# Patient Record
Sex: Male | Born: 1960 | Race: White | Hispanic: No | Marital: Married | Smoking: Current every day smoker
Health system: Southern US, Community
[De-identification: ages and names within clinical notes are randomized; demographics above are authoritative.]

## PROBLEM LIST (undated history)

## (undated) DIAGNOSIS — G8929 Other chronic pain: Secondary | ICD-10-CM

## (undated) DIAGNOSIS — E785 Hyperlipidemia, unspecified: Secondary | ICD-10-CM

## (undated) DIAGNOSIS — M1990 Unspecified osteoarthritis, unspecified site: Secondary | ICD-10-CM

## (undated) HISTORY — DX: Hyperlipidemia, unspecified: E78.5

## (undated) HISTORY — DX: Other chronic pain: G89.29

## (undated) HISTORY — PX: CERVICAL SPINE SURGERY: SHX589

---

## 2005-07-17 ENCOUNTER — Ambulatory Visit: Payer: Self-pay | Admitting: Orthopedic Surgery

## 2005-07-28 ENCOUNTER — Ambulatory Visit: Payer: Self-pay | Admitting: Orthopedic Surgery

## 2005-09-08 ENCOUNTER — Ambulatory Visit: Payer: Self-pay | Admitting: Orthopedic Surgery

## 2005-09-26 ENCOUNTER — Ambulatory Visit: Payer: Self-pay | Admitting: Orthopedic Surgery

## 2005-09-26 ENCOUNTER — Ambulatory Visit (HOSPITAL_COMMUNITY): Admission: RE | Admit: 2005-09-26 | Discharge: 2005-09-26 | Payer: Self-pay | Admitting: Orthopedic Surgery

## 2005-09-29 ENCOUNTER — Ambulatory Visit: Payer: Self-pay | Admitting: Orthopedic Surgery

## 2005-10-13 ENCOUNTER — Ambulatory Visit: Payer: Self-pay | Admitting: Orthopedic Surgery

## 2005-11-20 ENCOUNTER — Ambulatory Visit: Payer: Self-pay | Admitting: Orthopedic Surgery

## 2017-10-30 ENCOUNTER — Encounter: Payer: Self-pay | Admitting: Cardiovascular Disease

## 2017-10-30 ENCOUNTER — Ambulatory Visit: Payer: Managed Care, Other (non HMO) | Admitting: Cardiovascular Disease

## 2017-10-30 VITALS — BP 108/64 | HR 62 | Ht 64.0 in | Wt 150.0 lb

## 2017-10-30 DIAGNOSIS — R0789 Other chest pain: Secondary | ICD-10-CM

## 2017-10-30 NOTE — Patient Instructions (Signed)
Medication Instructions: Your physician recommends that you continue on your current medications as directed. Please refer to the Current Medication list given to you today.   Testing/Procedures: Your physician has requested that you have an exercise stress myoview. For further information please visit [email or web address]. Please follow instruction sheet, as given.  Follow-Up: Your physician recommends that you schedule a follow-up appointment as needed with Dr. Berry.   

## 2017-10-30 NOTE — Progress Notes (Signed)
10/30/2017 Billy KraussJohn A Richards   14-May-1961  [id number]  Primary Physician Patient, No Pcp Per Primary Cardiologist: Runell GessJonathan J Tonye Tancredi MD Roseanne RenoFACP, FACC, FAHA, FSCAI  HPI:  Billy Richards is a 57 y.o. thin-appearing married Caucasian male father of 2 daughters, grandfather and 4 grandchildren who works as a Pensions consultanttechnician at [employer]. He was [facility] room for cardiovascular evaluation because of a recent admission for chest pain/rule out myocardial infarction. His only risk factor is tobacco abuse having smoked 20-pack-years currently smoking one half pack per day. There is no family history. Never had a heart attack or stroke. He was awakened with [place] morning with chest pain on 10/24/17 and went to the emergency room previous EKG showed nonspecific changes and his enzymes were negative. A chest CT was negative for pulmonary embolus. He has had several less intense episodes of chest pain since discharge.    Current Meds  Medication Sig  . meloxicam (MOBIC) 15 MG tablet Take 15 mg by mouth daily. with food  . traMADol (ULTRAM) 50 MG tablet Take 1 tablet by mouth daily.     Not on File  Social History   Socioeconomic History  . Marital status: Married    Spouse name: Not on file  . Number of children: Not on file  . Years of education: Not on file  . Highest education level: Not on file  Occupational History  . Not on file  Social Needs  . Financial resource strain: Not on file  . Food insecurity:    Worry: Not on file    Inability: Not on file  . Transportation needs:    Medical: Not on file    Non-medical: Not on file  Tobacco Use  . Smoking status: Current Every Day Smoker  . Smokeless tobacco: Never Used  Substance and Sexual Activity  . Alcohol use: Not on file  . Drug use: Not on file  . Sexual activity: Not on file  Lifestyle  . Physical activity:    Days per week: Not on file    Minutes per session: Not on file  . Stress: Not on  file  Relationships  . Social connections:    Talks on phone: Not on file    Gets together: Not on file    Attends religious service: Not on file    Active member of club or organization: Not on file    Attends meetings of clubs or organizations: Not on file    Relationship status: Not on file  . Intimate partner violence:    Fear of current or ex partner: Not on file    Emotionally abused: Not on file    Physically abused: Not on file    Forced sexual activity: Not on file  Other Topics Concern  . Not on file  Social History Narrative  . Not on file     Review of Systems: General: negative for chills, fever, night sweats or weight changes.  Cardiovascular: negative for chest pain, dyspnea on exertion, edema, orthopnea, palpitations, paroxysmal nocturnal dyspnea or shortness of breath Dermatological: negative for rash Respiratory: negative for cough or wheezing Urologic: negative for hematuria Abdominal: negative for nausea, vomiting, diarrhea, bright red blood per rectum, melena, or hematemesis Neurologic: negative for visual changes, syncope, or dizziness All other systems reviewed and are otherwise negative except as noted above.    Blood pressure 108/64, pulse 62, height 5\' 4"  (1.626 m), weight 150 lb (68 kg).  General appearance: alert and no distress Neck: no adenopathy, no carotid bruit, no JVD, supple, symmetrical, trachea midline and thyroid not enlarged, symmetric, no tenderness/mass/nodules Lungs: clear to auscultation bilaterally Heart: regular rate and rhythm, S1, S2 normal, no murmur, click, rub or gallop Extremities: extremities normal, atraumatic, no cyanosis or edema Pulses: 2+ and symmetric Skin: Skin color, texture, turgor normal. No rashes or lesions Neurologic: Alert and oriented X 3, normal strength and tone. Normal symmetric reflexes. Normal coordination and gait  EKG sinus rhythm at 62 without ST or T-wave changes. I personally reviewed this  EKG.  ASSESSMENT AND PLAN:   Atypical chest pain Mr. Dobrowski is referred here for atypical chest pain after a recent chest pain rule out MI admission at [facility] on 10/24/17. His only cardiac risk factor is tobacco abuse having smoked 20-pack-years and continuing to smoke a half a pack a day. He's never had a heart attack or stroke. There is no family history. He was awakened at 3 in the morning with substernal chest pressure. He was seen in the emergency room and admitted. For several EKGs showed minimal abnormalities and enzymes were negative. A CT scan showed no evidence of pulmonary embolus. He's had several recurrent less severe episodes since discharge. I believe it would be prudent to proceed with exercise stress testing to rule out CAD.      Runell Gess MD FACP,FACC,FAHA, [facility] 10/30/2017 12:15 PM

## 2017-10-30 NOTE — Assessment & Plan Note (Signed)
Mr. Billy Richards is referred here for atypical chest pain after a recent chest pain rule out MI admission at [facility] on 10/24/17. His only cardiac risk factor is tobacco abuse having smoked 20-pack-years and continuing to smoke a half a pack a day. He's never had a heart attack or stroke. There is no family history. He was awakened at 3 in the morning with substernal chest pressure. He was seen in the emergency room and admitted. For several EKGs showed minimal abnormalities and enzymes were negative. A CT scan showed no evidence of pulmonary embolus. He's had several recurrent less severe episodes since discharge. I believe it would be prudent to proceed with exercise stress testing to rule out CAD.

## 2017-11-03 ENCOUNTER — Telehealth (HOSPITAL_COMMUNITY): Payer: Self-pay

## 2017-11-03 NOTE — Addendum Note (Signed)
Addended by: Kandice RobinsonsYOUNG, Tagen Milby T on: 11/03/2017 10:29 AM   Modules accepted: Orders

## 2017-11-03 NOTE — Telephone Encounter (Signed)
Encounter complete. 

## 2017-11-05 ENCOUNTER — Ambulatory Visit (HOSPITAL_COMMUNITY)
Admission: RE | Admit: 2017-11-05 | Discharge: 2017-11-05 | Disposition: A | Discharge status: Home / Self Care | Payer: Managed Care, Other (non HMO) | Source: Ambulatory Visit | Attending: Cardiology | Admitting: Cardiology

## 2017-11-05 DIAGNOSIS — R0789 Other chest pain: Secondary | ICD-10-CM

## 2017-11-05 LAB — MYOCARDIAL PERFUSION IMAGING
CHL CUP MPHR: 165 {beats}/min
CHL CUP NUCLEAR SDS: 1
CHL CUP NUCLEAR SSS: 1
CHL CUP RESTING HR STRESS: 58 {beats}/min
CHL RATE OF PERCEIVED EXERTION: 18
CSEPEW: 12 METS
CSEPHR: 96 %
Exercise duration (min): 10 min
Exercise duration (sec): 10 s
LV dias vol: 100 mL (ref 62–150)
LV sys vol: 44 mL
NUC STRESS TID: 0.92
Peak HR: 160 {beats}/min
SRS: 0

## 2017-11-05 MED ORDER — TECHNETIUM TC 99M TETROFOSMIN IV KIT
9.9000 | PACK | Freq: Once | INTRAVENOUS | Status: AC | PRN
  Administered 2017-11-05: 9.9 via INTRAVENOUS
  Filled 2017-11-05: qty 10

## 2017-11-05 MED ORDER — TECHNETIUM TC 99M TETROFOSMIN IV KIT
30.2000 | PACK | Freq: Once | INTRAVENOUS | Status: AC | PRN
  Administered 2017-11-05: 30.2 via INTRAVENOUS
  Filled 2017-11-05: qty 31

## 2019-08-30 IMAGING — NM NM MISC PROCEDURE
9 series · 54 of 54 positions shown · non-contrast
Comparison: none

[Series 1: wbr rest · 6.40mm/px · 6 of 64 frames shown]
[frame 6/64]
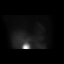
[frame 16/64]
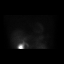
[frame 27/64]
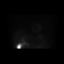
[frame 38/64]
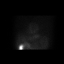
[frame 48/64]
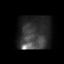
[frame 59/64]
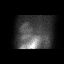

[Series 1: wbr_r-proj_st wbr rest · 6.40mm/px · 6 of 64 frames shown]
[frame 6/64]
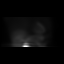
[frame 16/64]
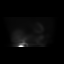
[frame 27/64]
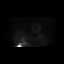
[frame 38/64]
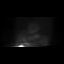
[frame 48/64]
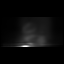
[frame 59/64]
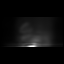

[Series 1: rest sax · 6.4mm · 6.40mm/px · 6 of 20 frames shown]
[frame 2/20]
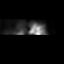
[frame 5/20]
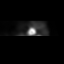
[frame 9/20]
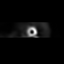
[frame 12/20]
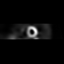
[frame 15/20]
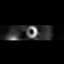
[frame 19/20]
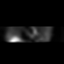

[Series 2: wbr stress-gsp · 6.40mm/px · 6 of 511 frames shown]
[frame 43/511  full-range]
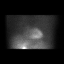
[frame 128/511  full-range]
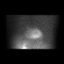
[frame 213/511  full-range]
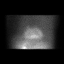
[frame 298/511  full-range]
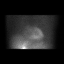
[frame 383/511  full-range]
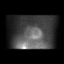
[frame 469/511  full-range]
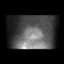

[Series 2: stress sax gs · 6.4mm · 6.40mm/px · 6 of 160 frames shown]
[frame 14/160]
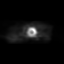
[frame 40/160]
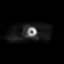
[frame 67/160]
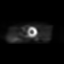
[frame 94/160]
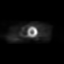
[frame 120/160]
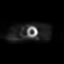
[frame 147/160]
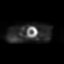

[Series 2: wbr_s-proj_st wbr stress-gsp · 6.40mm/px · 6 of 512 frames shown]
[frame 43/512]
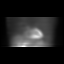
[frame 128/512]
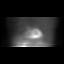
[frame 214/512]
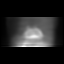
[frame 299/512]
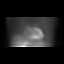
[frame 384/512]
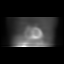
[frame 470/512]
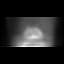

[Series 3: wbr stress-sum-em · 6.40mm/px · 6 of 64 frames shown]
[frame 6/64]
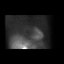
[frame 16/64]
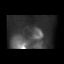
[frame 27/64]
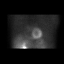
[frame 38/64]
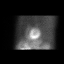
[frame 48/64]
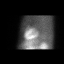
[frame 59/64]
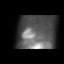

[Series 3: wbr_s-proj_st wbr stress-sum-em · 6.40mm/px · 6 of 64 frames shown]
[frame 6/64]
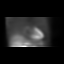
[frame 16/64]
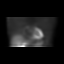
[frame 27/64]
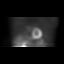
[frame 38/64]
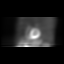
[frame 48/64]
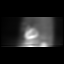
[frame 59/64]
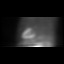

[Series 3: stress sax · 6.4mm · 6.40mm/px · 6 of 20 frames shown]
[frame 2/20]
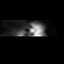
[frame 5/20]
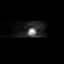
[frame 9/20]
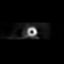
[frame 12/20]
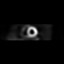
[frame 15/20]
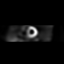
[frame 19/20]
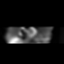

[54 of 54 positions shown; findings below may reference images not displayed]

Canned report from images found in remote index.

Refer to host system for actual result text.

## 2021-02-27 DIAGNOSIS — E782 Mixed hyperlipidemia: Secondary | ICD-10-CM | POA: Diagnosis not present

## 2021-02-27 DIAGNOSIS — R7301 Impaired fasting glucose: Secondary | ICD-10-CM | POA: Diagnosis not present

## 2021-02-27 DIAGNOSIS — R972 Elevated prostate specific antigen [PSA]: Secondary | ICD-10-CM | POA: Diagnosis not present

## 2021-03-04 DIAGNOSIS — R7303 Prediabetes: Secondary | ICD-10-CM | POA: Diagnosis not present

## 2021-03-04 DIAGNOSIS — F17200 Nicotine dependence, unspecified, uncomplicated: Secondary | ICD-10-CM | POA: Diagnosis not present

## 2021-03-04 DIAGNOSIS — M542 Cervicalgia: Secondary | ICD-10-CM | POA: Diagnosis not present

## 2021-03-04 DIAGNOSIS — G629 Polyneuropathy, unspecified: Secondary | ICD-10-CM | POA: Diagnosis not present

## 2021-03-04 DIAGNOSIS — Z113 Encounter for screening for infections with a predominantly sexual mode of transmission: Secondary | ICD-10-CM | POA: Diagnosis not present

## 2021-03-04 DIAGNOSIS — Z0001 Encounter for general adult medical examination with abnormal findings: Secondary | ICD-10-CM | POA: Diagnosis not present

## 2021-03-04 DIAGNOSIS — E782 Mixed hyperlipidemia: Secondary | ICD-10-CM | POA: Diagnosis not present

## 2021-03-04 DIAGNOSIS — G8929 Other chronic pain: Secondary | ICD-10-CM | POA: Diagnosis not present

## 2021-03-04 DIAGNOSIS — M5030 Other cervical disc degeneration, unspecified cervical region: Secondary | ICD-10-CM | POA: Diagnosis not present

## 2021-05-27 DIAGNOSIS — R0781 Pleurodynia: Secondary | ICD-10-CM | POA: Diagnosis not present

## 2021-05-27 DIAGNOSIS — G8929 Other chronic pain: Secondary | ICD-10-CM | POA: Diagnosis not present

## 2021-08-28 DIAGNOSIS — G8929 Other chronic pain: Secondary | ICD-10-CM | POA: Diagnosis not present

## 2021-08-28 DIAGNOSIS — M545 Low back pain, unspecified: Secondary | ICD-10-CM | POA: Diagnosis not present

## 2021-11-26 DIAGNOSIS — G8929 Other chronic pain: Secondary | ICD-10-CM | POA: Diagnosis not present

## 2021-11-26 DIAGNOSIS — M545 Low back pain, unspecified: Secondary | ICD-10-CM | POA: Diagnosis not present

## 2022-02-27 DIAGNOSIS — R7303 Prediabetes: Secondary | ICD-10-CM | POA: Diagnosis not present

## 2022-02-27 DIAGNOSIS — E782 Mixed hyperlipidemia: Secondary | ICD-10-CM | POA: Diagnosis not present

## 2022-03-06 DIAGNOSIS — G8929 Other chronic pain: Secondary | ICD-10-CM | POA: Diagnosis not present

## 2022-03-06 DIAGNOSIS — M5030 Other cervical disc degeneration, unspecified cervical region: Secondary | ICD-10-CM | POA: Diagnosis not present

## 2022-03-06 DIAGNOSIS — R7303 Prediabetes: Secondary | ICD-10-CM | POA: Diagnosis not present

## 2022-03-06 DIAGNOSIS — B009 Herpesviral infection, unspecified: Secondary | ICD-10-CM | POA: Diagnosis not present

## 2022-03-06 DIAGNOSIS — Z0001 Encounter for general adult medical examination with abnormal findings: Secondary | ICD-10-CM | POA: Diagnosis not present

## 2022-03-06 DIAGNOSIS — G629 Polyneuropathy, unspecified: Secondary | ICD-10-CM | POA: Diagnosis not present

## 2022-03-06 DIAGNOSIS — M542 Cervicalgia: Secondary | ICD-10-CM | POA: Diagnosis not present

## 2022-03-06 DIAGNOSIS — F17200 Nicotine dependence, unspecified, uncomplicated: Secondary | ICD-10-CM | POA: Diagnosis not present

## 2022-03-06 DIAGNOSIS — E782 Mixed hyperlipidemia: Secondary | ICD-10-CM | POA: Diagnosis not present

## 2022-05-28 DIAGNOSIS — H5203 Hypermetropia, bilateral: Secondary | ICD-10-CM | POA: Diagnosis not present

## 2022-05-28 DIAGNOSIS — H40013 Open angle with borderline findings, low risk, bilateral: Secondary | ICD-10-CM | POA: Diagnosis not present

## 2022-05-28 DIAGNOSIS — H25813 Combined forms of age-related cataract, bilateral: Secondary | ICD-10-CM | POA: Diagnosis not present

## 2022-08-28 DIAGNOSIS — M5416 Radiculopathy, lumbar region: Secondary | ICD-10-CM | POA: Diagnosis not present

## 2022-09-09 DIAGNOSIS — M48061 Spinal stenosis, lumbar region without neurogenic claudication: Secondary | ICD-10-CM | POA: Diagnosis not present

## 2022-09-09 DIAGNOSIS — M5126 Other intervertebral disc displacement, lumbar region: Secondary | ICD-10-CM | POA: Diagnosis not present

## 2022-09-09 DIAGNOSIS — M5416 Radiculopathy, lumbar region: Secondary | ICD-10-CM | POA: Diagnosis not present

## 2022-09-09 DIAGNOSIS — M47816 Spondylosis without myelopathy or radiculopathy, lumbar region: Secondary | ICD-10-CM | POA: Diagnosis not present

## 2022-09-12 DIAGNOSIS — R7303 Prediabetes: Secondary | ICD-10-CM | POA: Diagnosis not present

## 2022-09-12 DIAGNOSIS — Z139 Encounter for screening, unspecified: Secondary | ICD-10-CM | POA: Diagnosis not present

## 2022-09-15 DIAGNOSIS — G629 Polyneuropathy, unspecified: Secondary | ICD-10-CM | POA: Diagnosis not present

## 2022-09-15 DIAGNOSIS — Z0000 Encounter for general adult medical examination without abnormal findings: Secondary | ICD-10-CM | POA: Diagnosis not present

## 2022-09-15 DIAGNOSIS — M5431 Sciatica, right side: Secondary | ICD-10-CM | POA: Diagnosis not present

## 2022-09-15 DIAGNOSIS — B009 Herpesviral infection, unspecified: Secondary | ICD-10-CM | POA: Diagnosis not present

## 2022-09-15 DIAGNOSIS — R7303 Prediabetes: Secondary | ICD-10-CM | POA: Diagnosis not present

## 2022-09-15 DIAGNOSIS — M5030 Other cervical disc degeneration, unspecified cervical region: Secondary | ICD-10-CM | POA: Diagnosis not present

## 2022-09-15 DIAGNOSIS — M542 Cervicalgia: Secondary | ICD-10-CM | POA: Diagnosis not present

## 2022-09-15 DIAGNOSIS — E782 Mixed hyperlipidemia: Secondary | ICD-10-CM | POA: Diagnosis not present

## 2022-09-15 DIAGNOSIS — F17210 Nicotine dependence, cigarettes, uncomplicated: Secondary | ICD-10-CM | POA: Diagnosis not present

## 2023-03-19 DIAGNOSIS — R7303 Prediabetes: Secondary | ICD-10-CM | POA: Diagnosis not present

## 2023-03-19 DIAGNOSIS — M5416 Radiculopathy, lumbar region: Secondary | ICD-10-CM | POA: Diagnosis not present

## 2023-04-06 DIAGNOSIS — M5416 Radiculopathy, lumbar region: Secondary | ICD-10-CM | POA: Diagnosis not present

## 2023-04-22 ENCOUNTER — Other Ambulatory Visit (HOSPITAL_COMMUNITY): Payer: Self-pay | Admitting: Family Medicine

## 2023-04-22 DIAGNOSIS — E782 Mixed hyperlipidemia: Secondary | ICD-10-CM

## 2023-04-22 DIAGNOSIS — Z0000 Encounter for general adult medical examination without abnormal findings: Secondary | ICD-10-CM | POA: Diagnosis not present

## 2023-04-22 DIAGNOSIS — E875 Hyperkalemia: Secondary | ICD-10-CM | POA: Diagnosis not present

## 2023-04-22 DIAGNOSIS — M5431 Sciatica, right side: Secondary | ICD-10-CM | POA: Diagnosis not present

## 2023-04-22 DIAGNOSIS — R7303 Prediabetes: Secondary | ICD-10-CM | POA: Diagnosis not present

## 2023-04-22 DIAGNOSIS — R5383 Other fatigue: Secondary | ICD-10-CM | POA: Diagnosis not present

## 2023-04-22 DIAGNOSIS — J0100 Acute maxillary sinusitis, unspecified: Secondary | ICD-10-CM | POA: Diagnosis not present

## 2023-04-22 DIAGNOSIS — Z0001 Encounter for general adult medical examination with abnormal findings: Secondary | ICD-10-CM | POA: Diagnosis not present

## 2023-04-22 DIAGNOSIS — M5030 Other cervical disc degeneration, unspecified cervical region: Secondary | ICD-10-CM | POA: Diagnosis not present

## 2023-04-30 DIAGNOSIS — M533 Sacrococcygeal disorders, not elsewhere classified: Secondary | ICD-10-CM | POA: Diagnosis not present

## 2023-05-12 DIAGNOSIS — M461 Sacroiliitis, not elsewhere classified: Secondary | ICD-10-CM | POA: Diagnosis not present

## 2023-05-29 ENCOUNTER — Ambulatory Visit (HOSPITAL_COMMUNITY): Admission: RE | Admit: 2023-05-29 | Payer: Managed Care, Other (non HMO) | Source: Ambulatory Visit

## 2023-05-29 ENCOUNTER — Encounter (HOSPITAL_COMMUNITY): Payer: Self-pay

## 2023-06-02 DIAGNOSIS — M461 Sacroiliitis, not elsewhere classified: Secondary | ICD-10-CM | POA: Diagnosis not present

## 2023-08-17 DIAGNOSIS — M461 Sacroiliitis, not elsewhere classified: Secondary | ICD-10-CM | POA: Diagnosis not present

## 2023-09-21 ENCOUNTER — Other Ambulatory Visit (HOSPITAL_COMMUNITY): Payer: Self-pay | Admitting: Internal Medicine

## 2023-09-21 DIAGNOSIS — E782 Mixed hyperlipidemia: Secondary | ICD-10-CM | POA: Diagnosis not present

## 2023-09-21 DIAGNOSIS — F17200 Nicotine dependence, unspecified, uncomplicated: Secondary | ICD-10-CM | POA: Diagnosis not present

## 2023-09-21 DIAGNOSIS — F17210 Nicotine dependence, cigarettes, uncomplicated: Secondary | ICD-10-CM | POA: Diagnosis not present

## 2023-09-21 DIAGNOSIS — Z125 Encounter for screening for malignant neoplasm of prostate: Secondary | ICD-10-CM | POA: Diagnosis not present

## 2023-09-21 DIAGNOSIS — J069 Acute upper respiratory infection, unspecified: Secondary | ICD-10-CM | POA: Diagnosis not present

## 2023-09-21 DIAGNOSIS — R7303 Prediabetes: Secondary | ICD-10-CM | POA: Diagnosis not present

## 2023-09-28 DIAGNOSIS — M461 Sacroiliitis, not elsewhere classified: Secondary | ICD-10-CM | POA: Diagnosis not present

## 2023-10-09 ENCOUNTER — Ambulatory Visit (HOSPITAL_COMMUNITY)
Admission: RE | Admit: 2023-10-09 | Discharge: 2023-10-09 | Disposition: A | Discharge status: Home / Self Care | Payer: Self-pay | Source: Ambulatory Visit | Attending: Internal Medicine | Admitting: Internal Medicine

## 2023-10-09 DIAGNOSIS — E782 Mixed hyperlipidemia: Secondary | ICD-10-CM | POA: Insufficient documentation

## 2023-10-16 DIAGNOSIS — R7303 Prediabetes: Secondary | ICD-10-CM | POA: Diagnosis not present

## 2023-10-16 DIAGNOSIS — Z125 Encounter for screening for malignant neoplasm of prostate: Secondary | ICD-10-CM | POA: Diagnosis not present

## 2023-10-16 DIAGNOSIS — E782 Mixed hyperlipidemia: Secondary | ICD-10-CM | POA: Diagnosis not present

## 2023-10-29 DIAGNOSIS — E875 Hyperkalemia: Secondary | ICD-10-CM | POA: Diagnosis not present

## 2023-10-29 DIAGNOSIS — F17210 Nicotine dependence, cigarettes, uncomplicated: Secondary | ICD-10-CM | POA: Diagnosis not present

## 2023-10-29 DIAGNOSIS — G629 Polyneuropathy, unspecified: Secondary | ICD-10-CM | POA: Diagnosis not present

## 2023-10-29 DIAGNOSIS — M5030 Other cervical disc degeneration, unspecified cervical region: Secondary | ICD-10-CM | POA: Diagnosis not present

## 2023-10-29 DIAGNOSIS — M5431 Sciatica, right side: Secondary | ICD-10-CM | POA: Diagnosis not present

## 2023-10-29 DIAGNOSIS — R7303 Prediabetes: Secondary | ICD-10-CM | POA: Diagnosis not present

## 2023-10-29 DIAGNOSIS — M542 Cervicalgia: Secondary | ICD-10-CM | POA: Diagnosis not present

## 2023-10-29 DIAGNOSIS — J069 Acute upper respiratory infection, unspecified: Secondary | ICD-10-CM | POA: Diagnosis not present

## 2023-10-29 DIAGNOSIS — E782 Mixed hyperlipidemia: Secondary | ICD-10-CM | POA: Diagnosis not present

## 2023-11-16 DIAGNOSIS — M461 Sacroiliitis, not elsewhere classified: Secondary | ICD-10-CM | POA: Diagnosis not present

## 2023-12-15 DIAGNOSIS — R109 Unspecified abdominal pain: Secondary | ICD-10-CM | POA: Diagnosis not present

## 2023-12-15 DIAGNOSIS — L2989 Other pruritus: Secondary | ICD-10-CM | POA: Diagnosis not present

## 2023-12-15 DIAGNOSIS — R21 Rash and other nonspecific skin eruption: Secondary | ICD-10-CM | POA: Diagnosis not present

## 2024-01-08 ENCOUNTER — Other Ambulatory Visit (HOSPITAL_COMMUNITY): Payer: Self-pay | Admitting: Internal Medicine

## 2024-01-08 DIAGNOSIS — R109 Unspecified abdominal pain: Secondary | ICD-10-CM | POA: Diagnosis not present

## 2024-01-08 DIAGNOSIS — R634 Abnormal weight loss: Secondary | ICD-10-CM | POA: Diagnosis not present

## 2024-01-08 DIAGNOSIS — R21 Rash and other nonspecific skin eruption: Secondary | ICD-10-CM | POA: Diagnosis not present

## 2024-01-21 ENCOUNTER — Encounter: Payer: Self-pay | Admitting: Internal Medicine

## 2024-01-21 ENCOUNTER — Other Ambulatory Visit: Payer: Self-pay | Admitting: *Deleted

## 2024-01-21 ENCOUNTER — Ambulatory Visit: Admitting: Internal Medicine

## 2024-01-21 ENCOUNTER — Telehealth: Payer: Self-pay | Admitting: *Deleted

## 2024-01-21 VITALS — BP 120/73 | HR 64 | Temp 97.8°F | Ht 66.0 in | Wt 135.6 lb

## 2024-01-21 DIAGNOSIS — R1013 Epigastric pain: Secondary | ICD-10-CM

## 2024-01-21 DIAGNOSIS — R1310 Dysphagia, unspecified: Secondary | ICD-10-CM | POA: Diagnosis not present

## 2024-01-21 DIAGNOSIS — Z91014 Allergy to mammalian meats: Secondary | ICD-10-CM | POA: Diagnosis not present

## 2024-01-21 DIAGNOSIS — Z1211 Encounter for screening for malignant neoplasm of colon: Secondary | ICD-10-CM

## 2024-01-21 DIAGNOSIS — R634 Abnormal weight loss: Secondary | ICD-10-CM

## 2024-01-21 DIAGNOSIS — K219 Gastro-esophageal reflux disease without esophagitis: Secondary | ICD-10-CM

## 2024-01-21 DIAGNOSIS — Z791 Long term (current) use of non-steroidal anti-inflammatories (NSAID): Secondary | ICD-10-CM

## 2024-01-21 MED ORDER — PANTOPRAZOLE SODIUM 40 MG PO TBEC: 40.0000 mg | DELAYED_RELEASE_TABLET | Freq: Two times a day (BID) | ORAL | 11 refills | Status: DC

## 2024-01-21 MED ORDER — NA SULFATE-K SULFATE-MG SULF 17.5-3.13-1.6 GM/177ML PO SOLN: 1.0000 | Freq: Once | ORAL | 0 refills | Status: AC

## 2024-01-21 NOTE — Telephone Encounter (Addendum)
 PA approved via cohere for procedures Authorization #[id number] DOS: 02/09/2024 - 04/11/2024

## 2024-01-21 NOTE — Telephone Encounter (Signed)
 Tracking #: [id number] Authorization #[id number]  Date(s) of Service: 01/21/2024 - 03/21/2024

## 2024-01-21 NOTE — Telephone Encounter (Signed)
 For CT A/P Pending: In clinical review Tracking [id number]

## 2024-01-21 NOTE — H&P (View-Only) (Signed)
 Primary Care Physician:  Patient, No Pcp Per Primary Gastroenterologist:  Dr. Mordechai April  Chief Complaint  Patient presents with   New Patient (Initial Visit)    Referred for abd pain X 8 weeks.( Upper ), eating makes it better. Before abd pai pt had gas but not so much now    HPI:   Billy Richards is a 63 y.o. male who presents to clinic today by referral from his PCP Dr. Del Favia for evaluation.    Epigastric pain-patient was in his normal state of health until approximately 2 months ago when he had onset of epigastric pain.  Intermittent, moderate in severity.  Occurs 2-3 times a day.  Improved with eating.  Does not radiate.  Had blood work done by his PCP which showed elevated lipase of 151.  Patient denies any chronic alcohol use.  Does note chronic smoking.  Gallbladder in situ.  Prior to onset of symptoms, was taking anywhere from 4-8 ibuprofen daily for chronic joint pains.  Stopped this, now taking tramadol and Tylenol.  Started on pantoprazole 40 mg daily which she states he has been taking for a few weeks now.  Reports slight improvement in his symptoms.  Denies any nausea or vomiting, diarrhea, constipation.  No melena hematochezia.  Does report associated weight loss however.  Lost nearly 20 pounds.  States this has plateaued and he is slowly putting back on weight.  Esophageal dysphagia-Notes intermittent dysphagia with feeling like food gets stuck in his chest.  No prior upper endoscopy.  Denies any heartburn or acid reflux.  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  Now avoiding red meat, pork, and lamb.  Colon cancer screening-reports last colonoscopy greater than 10 years ago.  Reports this was normal although I do not have access to this report.        Past Medical History:  Diagnosis Date   Chronic neck pain    Dyslipidemia     No past surgical history on file.  Current Outpatient Medications  Medication Sig  Dispense Refill   atorvastatin (LIPITOR) 10 MG tablet Take 10 mg by mouth daily.     gabapentin (NEURONTIN) 300 MG capsule Take 300 mg by mouth 2 (two) times daily.     traMADol (ULTRAM) 50 MG tablet Take 1 tablet by mouth daily.  0   meloxicam (MOBIC) 15 MG tablet Take 15 mg by mouth daily. with food (Patient not taking: Reported on 01/21/2024)  6   pantoprazole (PROTONIX) 40 MG tablet Take 1 tablet (40 mg total) by mouth 2 (two) times daily. 60 tablet 11   No current facility-administered medications for this visit.    Allergies as of 01/21/2024   (No Known Allergies)    No family history on file.  Social History   Socioeconomic History   Marital status: Married    Spouse name: Not on file   Number of children: Not on file   Years of education: Not on file   Highest education level: Not on file  Occupational History   Not on file  Tobacco Use   Smoking status: Every Day   Smokeless tobacco: Never  Substance and Sexual Activity   Alcohol use: Not on file   Drug use: Not on file   Sexual activity: Not on file  Other Topics Concern   Not on file  Social History Narrative   Not on file   Social Drivers of Health   Financial  Resource Strain: Not on file  Food Insecurity: Not on file  Transportation Needs: Not on file  Physical Activity: Not on file  Stress: Not on file  Social Connections: Not on file  Intimate Partner Violence: Not on file    Subjective: Review of Systems  Constitutional:  Negative for chills and fever.  HENT:  Negative for congestion and hearing loss.   Eyes:  Negative for blurred vision and double vision.  Respiratory:  Negative for cough and shortness of breath.   Cardiovascular:  Negative for chest pain and palpitations.  Gastrointestinal:  Positive for abdominal pain. Negative for blood in stool, constipation, diarrhea, heartburn, melena and vomiting.       Dysphagia  Genitourinary:  Negative for dysuria and urgency.  Musculoskeletal:   Negative for joint pain and myalgias.  Skin:  Negative for itching and rash.  Neurological:  Negative for dizziness and headaches.  Psychiatric/Behavioral:  Negative for depression. The patient is not nervous/anxious.        Objective: BP 120/73   Pulse 64   Temp 97.8 F (36.6 C)   Ht 5' 6 (1.676 m)   Wt 135 lb 9.6 oz (61.5 kg)   BMI 21.89 kg/m  Physical Exam Constitutional:      Appearance: Normal appearance.  HENT:     Head: Normocephalic and atraumatic.   Eyes:     Extraocular Movements: Extraocular movements intact.     Conjunctiva/sclera: Conjunctivae normal.    Cardiovascular:     Rate and Rhythm: Normal rate and regular rhythm.  Pulmonary:     Effort: Pulmonary effort is normal.     Breath sounds: Normal breath sounds.  Abdominal:     General: Bowel sounds are normal.     Palpations: Abdomen is soft.   Musculoskeletal:        General: Normal range of motion.     Cervical back: Normal range of motion and neck supple.   Skin:    General: Skin is warm.   Neurological:     General: No focal deficit present.     Mental Status: He is alert and oriented to person, place, and time.   Psychiatric:        Mood and Affect: Mood normal.        Behavior: Behavior normal.      Assessment/Plan:  1.  Epigastric pain, weight loss-high suspicion for peptic ulcer disease in the setting of chronic NSAID use.  Given elevated lipase, cannot rule out pancreatic etiology.  Scheduled for CT scan of his abdomen pelvis tomorrow.  I will attempt to change this to with IV and oral contrast.    Will increase pantoprazole to twice daily.  She continue to avoid NSAIDs.  Will schedule for upper endoscopy to further evaluate.  2.  Esophageal dysphagia-EGD with possible dilation as above.  3.  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  Consider allergy referral.  4.  Colon cancer screening-at same time as upper endoscopy, we will perform colonoscopy for colon  cancer screening purposes.  01/21/2024 12:12 PM   Disclaimer: This note was dictated with voice recognition software. Similar sounding words can inadvertently be transcribed and may not be corrected upon review.

## 2024-01-21 NOTE — Patient Instructions (Signed)
 We will schedule you for upper endoscopy and colonoscopy in 3 weeks to evaluate your symptoms.  I may elect to stretch your esophagus depending on findings.  The colonoscopy we will do for colon cancer screening purposes.  I will review your CT scan and let you know results.  In the meantime I am going to increase your pantoprazole to twice daily.  Continue to avoid ibuprofen.  Continue to avoid meat pork and lamb.  It was very nice meeting both you today.  Dr. Mordechai April

## 2024-01-21 NOTE — Progress Notes (Signed)
 Primary Care Physician:  Patient, No Pcp Per Primary Gastroenterologist:  Dr. Mordechai April  Chief Complaint  Patient presents with   New Patient (Initial Visit)    Referred for abd pain X 8 weeks.( Upper ), eating makes it better. Before abd pai pt had gas but not so much now    HPI:   Billy Richards is a 63 y.o. male who presents to clinic today by referral from his PCP Dr. Del Favia for evaluation.    Epigastric pain-patient was in his normal state of health until approximately 2 months ago when he had onset of epigastric pain.  Intermittent, moderate in severity.  Occurs 2-3 times a day.  Improved with eating.  Does not radiate.  Had blood work done by his PCP which showed elevated lipase of 151.  Patient denies any chronic alcohol use.  Does note chronic smoking.  Gallbladder in situ.  Prior to onset of symptoms, was taking anywhere from 4-8 ibuprofen daily for chronic joint pains.  Stopped this, now taking tramadol and Tylenol.  Started on pantoprazole 40 mg daily which she states he has been taking for a few weeks now.  Reports slight improvement in his symptoms.  Denies any nausea or vomiting, diarrhea, constipation.  No melena hematochezia.  Does report associated weight loss however.  Lost nearly 20 pounds.  States this has plateaued and he is slowly putting back on weight.  Esophageal dysphagia-Notes intermittent dysphagia with feeling like food gets stuck in his chest.  No prior upper endoscopy.  Denies any heartburn or acid reflux.  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  Now avoiding red meat, pork, and lamb.  Colon cancer screening-reports last colonoscopy greater than 10 years ago.  Reports this was normal although I do not have access to this report.        Past Medical History:  Diagnosis Date   Chronic neck pain    Dyslipidemia     No past surgical history on file.  Current Outpatient Medications  Medication Sig  Dispense Refill   atorvastatin (LIPITOR) 10 MG tablet Take 10 mg by mouth daily.     gabapentin (NEURONTIN) 300 MG capsule Take 300 mg by mouth 2 (two) times daily.     traMADol (ULTRAM) 50 MG tablet Take 1 tablet by mouth daily.  0   meloxicam (MOBIC) 15 MG tablet Take 15 mg by mouth daily. with food (Patient not taking: Reported on 01/21/2024)  6   pantoprazole (PROTONIX) 40 MG tablet Take 1 tablet (40 mg total) by mouth 2 (two) times daily. 60 tablet 11   No current facility-administered medications for this visit.    Allergies as of 01/21/2024   (No Known Allergies)    No family history on file.  Social History   Socioeconomic History   Marital status: Married    Spouse name: Not on file   Number of children: Not on file   Years of education: Not on file   Highest education level: Not on file  Occupational History   Not on file  Tobacco Use   Smoking status: Every Day   Smokeless tobacco: Never  Substance and Sexual Activity   Alcohol use: Not on file   Drug use: Not on file   Sexual activity: Not on file  Other Topics Concern   Not on file  Social History Narrative   Not on file   Social Drivers of Health   Financial  Resource Strain: Not on file  Food Insecurity: Not on file  Transportation Needs: Not on file  Physical Activity: Not on file  Stress: Not on file  Social Connections: Not on file  Intimate Partner Violence: Not on file    Subjective: Review of Systems  Constitutional:  Negative for chills and fever.  HENT:  Negative for congestion and hearing loss.   Eyes:  Negative for blurred vision and double vision.  Respiratory:  Negative for cough and shortness of breath.   Cardiovascular:  Negative for chest pain and palpitations.  Gastrointestinal:  Positive for abdominal pain. Negative for blood in stool, constipation, diarrhea, heartburn, melena and vomiting.       Dysphagia  Genitourinary:  Negative for dysuria and urgency.  Musculoskeletal:   Negative for joint pain and myalgias.  Skin:  Negative for itching and rash.  Neurological:  Negative for dizziness and headaches.  Psychiatric/Behavioral:  Negative for depression. The patient is not nervous/anxious.        Objective: BP 120/73   Pulse 64   Temp 97.8 F (36.6 C)   Ht 5' 6 (1.676 m)   Wt 135 lb 9.6 oz (61.5 kg)   BMI 21.89 kg/m  Physical Exam Constitutional:      Appearance: Normal appearance.  HENT:     Head: Normocephalic and atraumatic.   Eyes:     Extraocular Movements: Extraocular movements intact.     Conjunctiva/sclera: Conjunctivae normal.    Cardiovascular:     Rate and Rhythm: Normal rate and regular rhythm.  Pulmonary:     Effort: Pulmonary effort is normal.     Breath sounds: Normal breath sounds.  Abdominal:     General: Bowel sounds are normal.     Palpations: Abdomen is soft.   Musculoskeletal:        General: Normal range of motion.     Cervical back: Normal range of motion and neck supple.   Skin:    General: Skin is warm.   Neurological:     General: No focal deficit present.     Mental Status: He is alert and oriented to person, place, and time.   Psychiatric:        Mood and Affect: Mood normal.        Behavior: Behavior normal.      Assessment/Plan:  1.  Epigastric pain, weight loss-high suspicion for peptic ulcer disease in the setting of chronic NSAID use.  Given elevated lipase, cannot rule out pancreatic etiology.  Scheduled for CT scan of his abdomen pelvis tomorrow.  I will attempt to change this to with IV and oral contrast.    Will increase pantoprazole to twice daily.  She continue to avoid NSAIDs.  Will schedule for upper endoscopy to further evaluate.  2.  Esophageal dysphagia-EGD with possible dilation as above.  3.  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  Consider allergy referral.  4.  Colon cancer screening-at same time as upper endoscopy, we will perform colonoscopy for colon  cancer screening purposes.  01/21/2024 12:12 PM   Disclaimer: This note was dictated with voice recognition software. Similar sounding words can inadvertently be transcribed and may not be corrected upon review.

## 2024-01-21 NOTE — Telephone Encounter (Signed)
 Pt aware his arrival time for CT tomorrow is 3pm.

## 2024-01-22 ENCOUNTER — Ambulatory Visit (HOSPITAL_COMMUNITY)

## 2024-01-22 ENCOUNTER — Ambulatory Visit (HOSPITAL_COMMUNITY)
Admission: RE | Admit: 2024-01-22 | Discharge: 2024-01-22 | Disposition: A | Discharge status: Home / Self Care | Source: Ambulatory Visit | Attending: Internal Medicine | Admitting: Internal Medicine

## 2024-01-22 ENCOUNTER — Encounter (HOSPITAL_COMMUNITY): Payer: Self-pay

## 2024-01-22 DIAGNOSIS — K5730 Diverticulosis of large intestine without perforation or abscess without bleeding: Secondary | ICD-10-CM | POA: Diagnosis not present

## 2024-01-22 DIAGNOSIS — R1013 Epigastric pain: Secondary | ICD-10-CM | POA: Insufficient documentation

## 2024-01-22 DIAGNOSIS — R1030 Lower abdominal pain, unspecified: Secondary | ICD-10-CM | POA: Diagnosis not present

## 2024-01-22 DIAGNOSIS — R160 Hepatomegaly, not elsewhere classified: Secondary | ICD-10-CM | POA: Diagnosis not present

## 2024-01-22 MED ORDER — IOHEXOL 300 MG/ML  SOLN
100.0000 mL | Freq: Once | INTRAMUSCULAR | Status: AC | PRN
Start: 2024-01-22 — End: 2024-01-22
  Administered 2024-01-22: 100 mL via INTRAVENOUS

## 2024-01-22 MED ORDER — IOHEXOL 9 MG/ML PO SOLN: 500.0000 mL | ORAL | Status: AC

## 2024-01-22 MED ORDER — IOHEXOL 9 MG/ML PO SOLN
ORAL | Status: AC
  Filled 2024-01-22: qty 1000

## 2024-01-24 ENCOUNTER — Ambulatory Visit: Payer: Self-pay | Admitting: Internal Medicine

## 2024-02-09 ENCOUNTER — Ambulatory Visit (HOSPITAL_COMMUNITY)
Admission: RE | Admit: 2024-02-09 | Discharge: 2024-02-09 | Disposition: A | Discharge status: Home / Self Care | Attending: Internal Medicine | Admitting: Internal Medicine

## 2024-02-09 ENCOUNTER — Encounter (HOSPITAL_COMMUNITY)
Admission: RE | Disposition: A | Discharge status: Home / Self Care | Payer: Self-pay | Source: Home / Self Care | Attending: Internal Medicine

## 2024-02-09 ENCOUNTER — Encounter (HOSPITAL_COMMUNITY): Payer: Self-pay | Admitting: Internal Medicine

## 2024-02-09 ENCOUNTER — Other Ambulatory Visit: Payer: Self-pay

## 2024-02-09 ENCOUNTER — Ambulatory Visit (HOSPITAL_COMMUNITY)

## 2024-02-09 DIAGNOSIS — K2270 Barrett's esophagus without dysplasia: Secondary | ICD-10-CM | POA: Insufficient documentation

## 2024-02-09 DIAGNOSIS — K2289 Other specified disease of esophagus: Secondary | ICD-10-CM | POA: Diagnosis not present

## 2024-02-09 DIAGNOSIS — K5730 Diverticulosis of large intestine without perforation or abscess without bleeding: Secondary | ICD-10-CM

## 2024-02-09 DIAGNOSIS — K635 Polyp of colon: Secondary | ICD-10-CM | POA: Diagnosis not present

## 2024-02-09 DIAGNOSIS — G8929 Other chronic pain: Secondary | ICD-10-CM | POA: Insufficient documentation

## 2024-02-09 DIAGNOSIS — M1990 Unspecified osteoarthritis, unspecified site: Secondary | ICD-10-CM | POA: Insufficient documentation

## 2024-02-09 DIAGNOSIS — Z1211 Encounter for screening for malignant neoplasm of colon: Secondary | ICD-10-CM | POA: Insufficient documentation

## 2024-02-09 DIAGNOSIS — K648 Other hemorrhoids: Secondary | ICD-10-CM | POA: Diagnosis not present

## 2024-02-09 DIAGNOSIS — D123 Benign neoplasm of transverse colon: Secondary | ICD-10-CM | POA: Diagnosis not present

## 2024-02-09 DIAGNOSIS — Z79899 Other long term (current) drug therapy: Secondary | ICD-10-CM | POA: Insufficient documentation

## 2024-02-09 DIAGNOSIS — Z791 Long term (current) use of non-steroidal anti-inflammatories (NSAID): Secondary | ICD-10-CM | POA: Diagnosis not present

## 2024-02-09 DIAGNOSIS — D120 Benign neoplasm of cecum: Secondary | ICD-10-CM | POA: Diagnosis not present

## 2024-02-09 DIAGNOSIS — R1013 Epigastric pain: Secondary | ICD-10-CM | POA: Insufficient documentation

## 2024-02-09 DIAGNOSIS — M2550 Pain in unspecified joint: Secondary | ICD-10-CM | POA: Diagnosis not present

## 2024-02-09 DIAGNOSIS — R1314 Dysphagia, pharyngoesophageal phase: Secondary | ICD-10-CM | POA: Insufficient documentation

## 2024-02-09 DIAGNOSIS — F17200 Nicotine dependence, unspecified, uncomplicated: Secondary | ICD-10-CM | POA: Diagnosis not present

## 2024-02-09 DIAGNOSIS — K2970 Gastritis, unspecified, without bleeding: Secondary | ICD-10-CM | POA: Diagnosis not present

## 2024-02-09 DIAGNOSIS — K222 Esophageal obstruction: Secondary | ICD-10-CM | POA: Diagnosis not present

## 2024-02-09 DIAGNOSIS — K449 Diaphragmatic hernia without obstruction or gangrene: Secondary | ICD-10-CM | POA: Diagnosis not present

## 2024-02-09 DIAGNOSIS — K2100 Gastro-esophageal reflux disease with esophagitis, without bleeding: Secondary | ICD-10-CM | POA: Diagnosis not present

## 2024-02-09 DIAGNOSIS — K644 Residual hemorrhoidal skin tags: Secondary | ICD-10-CM

## 2024-02-09 HISTORY — PX: COLONOSCOPY: SHX5424

## 2024-02-09 HISTORY — DX: Unspecified osteoarthritis, unspecified site: M19.90

## 2024-02-09 HISTORY — PX: ESOPHAGEAL DILATION: SHX303

## 2024-02-09 HISTORY — PX: ESOPHAGOGASTRODUODENOSCOPY: SHX5428

## 2024-02-09 SURGERY — COLONOSCOPY
Anesthesia: General

## 2024-02-09 MED ORDER — GLYCOPYRROLATE PF 0.2 MG/ML IJ SOSY
PREFILLED_SYRINGE | INTRAMUSCULAR | Status: DC | PRN
  Administered 2024-02-09: .1 mg via INTRAVENOUS

## 2024-02-09 MED ORDER — LIDOCAINE 2% (20 MG/ML) 5 ML SYRINGE
INTRAMUSCULAR | Status: DC | PRN
Start: 2024-02-09 — End: 2024-02-09
  Administered 2024-02-09: 60 mg via INTRAVENOUS

## 2024-02-09 MED ORDER — PANTOPRAZOLE SODIUM 40 MG PO TBEC: 40.0000 mg | DELAYED_RELEASE_TABLET | Freq: Two times a day (BID) | ORAL | 11 refills | Status: AC

## 2024-02-09 MED ORDER — LACTATED RINGERS IV SOLN: INTRAVENOUS | Status: DC | PRN

## 2024-02-09 MED ORDER — PROPOFOL 10 MG/ML IV BOLUS
INTRAVENOUS | Status: DC | PRN
  Administered 2024-02-09: 150 ug/kg/min via INTRAVENOUS
  Administered 2024-02-09: 30 mg via INTRAVENOUS
  Administered 2024-02-09: 70 mg via INTRAVENOUS

## 2024-02-09 NOTE — Anesthesia Preprocedure Evaluation (Addendum)
 Anesthesia Evaluation  Patient identified by MRN, date of birth, ID band Patient awake    Reviewed: NPO status   History of Anesthesia Complications Negative for: history of anesthetic complications  Airway Mallampati: II  TM Distance: <3 FB Neck ROM: Full    Dental no notable dental hx. (+) Poor Dentition Poor dentition upper and lower teeth.:   Pulmonary Current Smoker and Patient abstained from smoking.   Pulmonary exam normal breath sounds clear to auscultation       Cardiovascular negative cardio ROS Normal cardiovascular exam Rhythm:Regular Rate:Normal     Neuro/Psych  negative psych ROS   GI/Hepatic Neg liver ROS,GERD  ,,  Endo/Other  negative endocrine ROS    Renal/GU negative Renal ROS  negative genitourinary   Musculoskeletal  (+) Arthritis ,    Abdominal   Peds  Hematology negative hematology ROS (+)   Anesthesia Other Findings   Reproductive/Obstetrics negative OB ROS                             Anesthesia Physical Anesthesia Plan  ASA: 2  Anesthesia Plan: General   Post-op Pain Management:    Induction: Intravenous  PONV Risk Score and Plan: 0  Airway Management Planned: Natural Airway  Additional Equipment:   Intra-op Plan:   Post-operative Plan:   Informed Consent:   Plan Discussed with: CRNA  Anesthesia Plan Comments:        Anesthesia Quick Evaluation

## 2024-02-09 NOTE — Interval H&P Note (Signed)
 History and Physical Interval Note:  02/09/2024 10:50 AM  Billy Richards  has presented today for surgery, with the diagnosis of COLON CANCER SCREENING, EPIGASTRIC PAIN, GERD, LOSS OF WEIGHT.  The various methods of treatment have been discussed with the patient and family. After consideration of risks, benefits and other options for treatment, the patient has consented to  Procedure(s) with comments: COLONOSCOPY (N/A) - 1245PM, ASA 2 EGD (ESOPHAGOGASTRODUODENOSCOPY) (N/A) DILATION, ESOPHAGUS (N/A) as a surgical intervention.  The patient's history has been reviewed, patient examined, no change in status, stable for surgery.  I have reviewed the patient's chart and labs.  Questions were answered to the patient's satisfaction.     Billy Richards

## 2024-02-09 NOTE — Transfer of Care (Signed)
 Immediate Anesthesia Transfer of Care Note  Patient: Billy Richards  Procedure(s) Performed: COLONOSCOPY EGD (ESOPHAGOGASTRODUODENOSCOPY) DILATION, ESOPHAGUS  Patient Location: Endoscopy Unit  Anesthesia Type:General  Level of Consciousness: drowsy and patient cooperative  Airway & Oxygen Therapy: Patient Spontanous Breathing  Post-op Assessment: Report given to RN and Post -op Vital signs reviewed and stable  Post vital signs: Reviewed and stable  Last Vitals:  Vitals Value Taken Time  BP 125/75 02/09/24 12:12  Temp    Pulse 64 02/09/24 12:12  Resp 16 02/09/24 12:12  SpO2 98 % 02/09/24 12:12    Last Pain:  Vitals:   02/09/24 1212  TempSrc:   PainSc: 0-No pain         Complications: No notable events documented.

## 2024-02-09 NOTE — Op Note (Signed)
 [facility] Patient Name: Billy Richards Procedure Date: 02/09/2024 11:02 AM MRN: [id number] Date of Birth: [birth year] Attending MD: Carlin POUR. Cindie , [place], [id number] CSN: [id number] Age: 63 Admit Type: Outpatient Procedure:                Upper GI endoscopy Indications:              Epigastric abdominal pain, Dysphagia Providers:                Carlin POUR. Cindie, DO, Tammy Vaught, RN, Jon LABOR.                            Gerome RN, RN Referring MD:             Carlin POUR. Cindie, DO Medicines:                See the Anesthesia note for documentation of the                            administered medications Complications:            No immediate complications. Estimated Blood Loss:     Estimated blood loss was minimal. Procedure:                Pre-Anesthesia Assessment:                           - The anesthesia plan was to use monitored                            anesthesia care (MAC).                           After obtaining informed consent, the endoscope was                            passed under direct vision. Throughout the                            procedure, the patient's blood pressure, pulse, and                            oxygen saturations were monitored continuously. The                            [id number]-H190 ([id number]) scope was introduced through the                            mouth, and advanced to the second part of duodenum.                            The upper GI endoscopy was accomplished without                            difficulty. The patient tolerated the procedure  well. Scope In: 11:35:02 AM Scope Out: 11:41:17 AM Total Procedure Duration: 0 hours 6 minutes 15 seconds  Findings:      There were esophageal mucosal changes suggestive of short-segment       Barrett's esophagus present at the gastroesophageal junction. The       maximum longitudinal extent of these mucosal changes was 2 cm in length.       Mucosa was biopsied  with a cold forceps for histology. One specimen       bottle was sent to pathology.      A small hiatal hernia was present.      A mild Schatzki ring was found in the distal esophagus. A TTS dilator       was passed through the scope. Dilation with an 18-19-20 mm balloon       dilator was performed to 20 mm. The dilation site was examined and       showed moderate improvement in luminal narrowing.      Diffuse mild inflammation characterized by erythema was found in the       entire examined stomach. Biopsies were taken with a cold forceps for       Helicobacter pylori testing.      The duodenal bulb, first portion of the duodenum and second portion of       the duodenum were normal. Impression:               - Esophageal mucosal changes suggestive of                            short-segment Barrett's esophagus. Biopsied.                           - Small hiatal hernia.                           - Mild Schatzki ring. Dilated.                           - Gastritis. Biopsied.                           - Normal duodenal bulb, first portion of the                            duodenum and second portion of the duodenum. Moderate Sedation:      Per Anesthesia Care Recommendation:           - Patient has a contact number available for                            emergencies. The signs and symptoms of potential                            delayed complications were discussed with the                            patient. Return to normal activities tomorrow.  Written discharge instructions were provided to the                            patient.                           - Resume previous diet.                           - Continue present medications.                           - Await pathology results.                           - Repeat upper endoscopy for surveillance based on                            pathology results.                           - Return to GI clinic  in 8 weeks.                           - Use a proton pump inhibitor PO BID.                           - No ibuprofen, naproxen, or other non-steroidal                            anti-inflammatory drugs. Procedure Code(s):        --- Professional ---                           [phone], Esophagogastroduodenoscopy, flexible,                            transoral; with transendoscopic balloon dilation of                            esophagus (less than 30 mm diameter)                           43239, 59, Esophagogastroduodenoscopy, flexible,                            transoral; with biopsy, single or multiple Diagnosis Code(s):        --- Professional ---                           K22.89, Other specified disease of esophagus                           K44.9, Diaphragmatic hernia without obstruction or                            gangrene  K22.2, Esophageal obstruction                           K29.70, Gastritis, unspecified, without bleeding                           R10.13, Epigastric pain                           R13.10, Dysphagia, unspecified CPT copyright 2022 [employer]. All rights reserved. The codes documented in this report are preliminary and upon coder review may  be revised to meet current compliance requirements. Carlin POUR. Cindie, DO Carlin POUR. Cindie, DO 02/09/2024 11:45:11 AM This report has been signed electronically. Number of Addenda: 0

## 2024-02-09 NOTE — Discharge Instructions (Addendum)
 EGD Discharge instructions Please read the instructions outlined below and refer to this sheet in the next few weeks. These discharge instructions provide you with general information on caring for yourself after you leave the hospital. Your doctor may also give you specific instructions. While your treatment has been planned according to the most current medical practices available, unavoidable complications occasionally occur. If you have any problems or questions after discharge, please call your doctor. ACTIVITY You may resume your regular activity but move at a slower pace for the next 24 hours.  Take frequent rest periods for the next 24 hours.  Walking will help expel (get rid of) the air and reduce the bloated feeling in your abdomen.  No driving for 24 hours (because of the anesthesia (medicine) used during the test).  You may shower.  Do not sign any important legal documents or operate any machinery for 24 hours (because of the anesthesia used during the test).  NUTRITION Drink plenty of fluids.  You may resume your normal diet.  Begin with a light meal and progress to your normal diet.  Avoid alcoholic beverages for 24 hours or as instructed by your caregiver.  MEDICATIONS You may resume your normal medications unless your caregiver tells you otherwise.  WHAT YOU CAN EXPECT TODAY You may experience abdominal discomfort such as a feeling of fullness or "gas" pains.  FOLLOW-UP Your doctor will discuss the results of your test with you.  SEEK IMMEDIATE MEDICAL ATTENTION IF ANY OF THE FOLLOWING OCCUR: Excessive nausea (feeling sick to your stomach) and/or vomiting.  Severe abdominal pain and distention (swelling).  Trouble swallowing.  Temperature over 101 F (37.8 C).  Rectal bleeding or vomiting of blood.     Colonoscopy Discharge Instructions  Read the instructions outlined below and refer to this sheet in the next few weeks. These discharge instructions provide you with  general information on caring for yourself after you leave the hospital. Your doctor may also give you specific instructions. While your treatment has been planned according to the most current medical practices available, unavoidable complications occasionally occur.   ACTIVITY You may resume your regular activity, but move at a slower pace for the next 24 hours.  Take frequent rest periods for the next 24 hours.  Walking will help get rid of the air and reduce the bloated feeling in your belly (abdomen).  No driving for 24 hours (because of the medicine (anesthesia) used during the test).   Do not sign any important legal documents or operate any machinery for 24 hours (because of the anesthesia used during the test).  NUTRITION Drink plenty of fluids.  You may resume your normal diet as instructed by your doctor.  Begin with a light meal and progress to your normal diet. Heavy or fried foods are harder to digest and may make you feel sick to your stomach (nauseated).  Avoid alcoholic beverages for 24 hours or as instructed.  MEDICATIONS You may resume your normal medications unless your doctor tells you otherwise.  WHAT YOU CAN EXPECT TODAY Some feelings of bloating in the abdomen.  Passage of more gas than usual.  Spotting of blood in your stool or on the toilet paper.  IF YOU HAD POLYPS REMOVED DURING THE COLONOSCOPY: No aspirin products for 7 days or as instructed.  No alcohol for 7 days or as instructed.  Eat a soft diet for the next 24 hours.  FINDING OUT THE RESULTS OF YOUR TEST Not all test results are  available during your visit. If your test results are not back during the visit, make an appointment with your caregiver to find out the results. Do not assume everything is normal if you have not heard from your caregiver or the medical facility. It is important for you to follow up on all of your test results.  SEEK IMMEDIATE MEDICAL ATTENTION IF: You have more than a spotting of  blood in your stool.  Your belly is swollen (abdominal distention).  You are nauseated or vomiting.  You have a temperature over 101.  You have abdominal pain or discomfort that is severe or gets worse throughout the day.   Your EGD revealed mild amount inflammation in your stomach.  I took biopsies of this to rule out infection with a bacteria called H. pylori.  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  I took samples of this as well.  You did have a tightening of your esophagus which I stretched out today.  Hopefully this helps with feeling of food getting stuck.  Small bowel appeared normal.  Continue on pantoprazole  twice daily.  Avoid NSAIDs.  Your colonoscopy revealed 3 polyp(s) which I removed successfully. Await pathology results, my office will contact you. I recommend repeating colonoscopy in 3 years for surveillance purposes.   You also have diverticulosis and internal hemorrhoids. I would recommend increasing fiber in your diet or adding OTC Benefiber/Metamucil. Be sure to drink at least 4 to 6 glasses of water daily.   Follow-up with GI in 6-8 week. Message sent to the office and will schedule the appointment.    I hope you have a great rest of your week!  Billy Richards. Billy Richards, D.O. Gastroenterology and Hepatology [facility]

## 2024-02-09 NOTE — Anesthesia Procedure Notes (Signed)
 Date/Time: 02/09/2024 11:31 AM  Performed by: Para Jerelene CROME, CRNAOxygen Delivery Method: Nasal cannula Comments: OptiFlow Nasal Cannula.

## 2024-02-09 NOTE — Anesthesia Postprocedure Evaluation (Signed)
 Anesthesia Post Note  Patient: Billy Richards  Procedure(s) Performed: COLONOSCOPY EGD (ESOPHAGOGASTRODUODENOSCOPY) DILATION, ESOPHAGUS  Patient location during evaluation: PACU Anesthesia Type: General Level of consciousness: awake and alert Pain management: pain level controlled Vital Signs Assessment: post-procedure vital signs reviewed and stable Respiratory status: spontaneous breathing, nonlabored ventilation, respiratory function stable and patient connected to nasal cannula oxygen Cardiovascular status: stable and blood pressure returned to baseline Postop Assessment: no apparent nausea or vomiting Anesthetic complications: no  No notable events documented.   Last Vitals:  Vitals:   02/09/24 1100 02/09/24 1212  BP: 131/79 125/75  Pulse: 61 64  Resp: 10 16  Temp: 36.9 C   SpO2: 99% 98%    Last Pain:  Vitals:   02/09/24 1212  TempSrc:   PainSc: 0-No pain                 Andrea Limes

## 2024-02-09 NOTE — Op Note (Signed)
 [facility] Patient Name: Billy Richards Procedure Date: 02/09/2024 11:43 AM MRN: [id number] Date of Birth: 08-22-1960 Attending MD: Carlin POUR. Cindie , [place], [id number] CSN: [id number] Age: 63 Admit Type: Outpatient Procedure:                Colonoscopy Indications:              Screening for colorectal malignant neoplasm Providers:                Carlin POUR. Cindie, DO, Tammy Vaught, RN, Jon LABOR.                            Gerome RN, RN Referring MD:             Carlin POUR. Cindie, DO Medicines:                See the Anesthesia note for documentation of the                            administered medications Complications:            No immediate complications. Estimated Blood Loss:     Estimated blood loss was minimal. Procedure:                Pre-Anesthesia Assessment:                           - The anesthesia plan was to use monitored                            anesthesia care (MAC).                           After obtaining informed consent, the colonoscope                            was passed under direct vision. Throughout the                            procedure, the patient's blood pressure, pulse, and                            oxygen saturations were monitored continuously. The                            PCF-HQ190L ([id number]) scope was introduced through                            the anus and advanced to the the cecum, identified                            by appendiceal orifice and ileocecal valve. The                            colonoscopy was somewhat difficult due to multiple  diverticula in the colon. The patient tolerated the                            procedure well. The quality of the bowel                            preparation was evaluated using the BBPS [facility]                            Bowel Preparation Scale) with scores of: Right                            Colon = 3, Transverse Colon = 3 and Left Colon = 3                             (entire mucosa seen well with no residual staining,                            small fragments of stool or opaque liquid). The                            total BBPS score equals 9. Scope In: 11:46:34 AM Scope Out: 12:08:09 PM Scope Withdrawal Time: 0 hours 10 minutes 23 seconds  Total Procedure Duration: 0 hours 21 minutes 35 seconds  Findings:      Hemorrhoids were found on perianal exam.      Non-bleeding internal hemorrhoids were found.      Multiple medium-mouthed and small-mouthed diverticula were found in the       sigmoid colon.      A 12 mm polyp was found in the cecum. The polyp was sessile. The polyp       was removed with a cold snare. Resection and retrieval were complete.      A 5 mm polyp was found in the cecum. The polyp was sessile. The polyp       was removed with a cold snare. Resection and retrieval were complete.      A 4 mm polyp was found in the transverse colon. The polyp was sessile.       The polyp was removed with a cold snare. Resection and retrieval were       complete. Impression:               - Hemorrhoids found on perianal exam.                           - Non-bleeding internal hemorrhoids.                           - Diverticulosis in the sigmoid colon.                           - One 12 mm polyp in the cecum, removed with a cold                            snare. Resected and retrieved.                           -  One 5 mm polyp in the cecum, removed with a cold                            snare. Resected and retrieved.                           - One 4 mm polyp in the transverse colon, removed                            with a cold snare. Resected and retrieved. Moderate Sedation:      Per Anesthesia Care Recommendation:           - Patient has a contact number available for                            emergencies. The signs and symptoms of potential                            delayed complications were discussed with the                             patient. Return to normal activities tomorrow.                            Written discharge instructions were provided to the                            patient.                           - Resume previous diet.                           - Continue present medications.                           - Await pathology results.                           - Repeat colonoscopy in 3 years for surveillance.                           - Return to GI clinic in 8 weeks. Procedure Code(s):        --- Professional ---                           [phone], Colonoscopy, flexible; with removal of                            tumor(s), polyp(s), or other lesion(s) by snare                            technique Diagnosis Code(s):        --- Professional ---  Z12.11, Encounter for screening for malignant                            neoplasm of colon                           K64.8, Other hemorrhoids                           D12.0, Benign neoplasm of cecum                           D12.3, Benign neoplasm of transverse colon (hepatic                            flexure or splenic flexure)                           K57.30, Diverticulosis of large intestine without                            perforation or abscess without bleeding CPT copyright 2022 American [employer] Association. All rights reserved. The codes documented in this report are preliminary and upon coder review may  be revised to meet current compliance requirements. Carlin POUR. Cindie, DO Carlin POUR. Cindie, DO 02/09/2024 12:21:05 PM This report has been signed electronically. Number of Addenda: 0

## 2024-02-10 ENCOUNTER — Encounter (HOSPITAL_COMMUNITY): Payer: Self-pay | Admitting: Internal Medicine

## 2024-02-11 LAB — SURGICAL PATHOLOGY

## 2024-02-17 DIAGNOSIS — F17200 Nicotine dependence, unspecified, uncomplicated: Secondary | ICD-10-CM | POA: Diagnosis not present

## 2024-02-17 DIAGNOSIS — M5030 Other cervical disc degeneration, unspecified cervical region: Secondary | ICD-10-CM | POA: Diagnosis not present

## 2024-02-17 DIAGNOSIS — M5431 Sciatica, right side: Secondary | ICD-10-CM | POA: Diagnosis not present

## 2024-02-17 DIAGNOSIS — R634 Abnormal weight loss: Secondary | ICD-10-CM | POA: Diagnosis not present

## 2024-02-17 DIAGNOSIS — M5441 Lumbago with sciatica, right side: Secondary | ICD-10-CM | POA: Diagnosis not present

## 2024-02-17 DIAGNOSIS — K2100 Gastro-esophageal reflux disease with esophagitis, without bleeding: Secondary | ICD-10-CM | POA: Diagnosis not present

## 2024-02-17 DIAGNOSIS — D126 Benign neoplasm of colon, unspecified: Secondary | ICD-10-CM | POA: Diagnosis not present

## 2024-02-23 ENCOUNTER — Ambulatory Visit: Payer: Self-pay | Admitting: Internal Medicine

## 2024-02-26 ENCOUNTER — Encounter: Payer: Self-pay | Admitting: Advanced Practice Midwife

## 2024-03-02 DIAGNOSIS — M461 Sacroiliitis, not elsewhere classified: Secondary | ICD-10-CM | POA: Diagnosis not present

## 2024-03-29 DIAGNOSIS — M461 Sacroiliitis, not elsewhere classified: Secondary | ICD-10-CM | POA: Diagnosis not present

## 2024-04-04 NOTE — Progress Notes (Unsigned)
 GI Office Note    Referring Provider: Shona Billy PEDLAR, MD Primary Care Physician:  Shona Billy PEDLAR, MD Primary Gastroenterologist: Carlin POUR. Cindie, DO  Date:  04/05/2024  ID:  Billy Richards, DOB 1961-08-07, MRN [id number]   Chief Complaint   Chief Complaint  Patient presents with   Follow-up    Follow up. No problems    History of Present Illness  Billy Richards is a 63 y.o. male with a history of dyslipidemia, arthritis, and alpha gal presenting today for follow up of dysphagia post procedures.   Initial office visit with Dr. Cindie 01/21/24.  Referred for evaluation of epigastric pain improved with eating.  Symptoms began about 2 months prior and was intermittent and moderate in severity occurring 2-3 times a day that improves with eating and not radiating.  Previously elevated lipase of 151.  Denied chronic alcohol use but chronic smoking is present.  Gallbladder in situ.  Taking 4-8 ibuprofen daily for chronic joint pain previously and had recently stopped and taking tramadol and Tylenol.  Started on pantoprazole  40 mg daily which she been taking for a few weeks with slight improvement of symptoms.  Denied nausea vomiting diarrhea or constipation.  No GI bleeding.  Previous weight loss of about 20 pounds but recently plateaued and slowly putting back on weight.  Reported intermittent dysphagia with food getting stuck in his chest.  Had positive alpha gal testing.  Last colonoscopy greater than 10 years prior.  Advised continue avoiding alpha gal, basal rates.  Schedule EGD with dilation and colonoscopy.  Increase PPI to twice daily and avoid NSAIDs.  Suspicion for pressure ulcer disease and elevated lipase unable to rule out pancreatic etiology therefore advised keep follow-up for CT scan of the abdomen pelvis.  CT A/P with contrast 01/22/24: Hepatomegaly, 19 cm. - No gallbladder wall thickening, cholelithiasis or biliary dilation - Normal pancreas, no ductal dilation. - Colonic  diverticulosis without wall thickening - Normal appendix - Possible tiny hiatal hernia.  EGD 02/09/24: - Esophageal mucosal changes suggestive of short- segment Barrett' s esophagus. Biopsied.  - Small hiatal hernia. - Mild Schatzki ring. Dilated. - Gastritis.  Biopsied. - Normal duodenum - Path: confirmed barretts esophagus. Chronic gastritis. Neg H pylori.  - Repeat in 5 years.   Colonoscopy 02/09/24: - Hemorrhoids found on perianal exam.  - Non- bleeding internal hemorrhoids.  - Diverticulosis in the sigmoid colon.  - One 12 mm polyp in the cecum - One 5 mm polyp in the cecum - One 4 mm polyp in the transverse colon. - path: tubular adenomas - Repeat in 3 years.    Today:  Discussed the use of AI scribe software for clinical note transcription with the patient, who gave verbal consent to proceed.  Wt Readings from Last 5 Encounters:  04/05/24 133 lb 6.4 oz (60.5 kg)  02/09/24 127 lb (57.6 kg)  01/21/24 135 lb 9.6 oz (61.5 kg)  11/05/17 150 lb (68 kg)  10/30/17 150 lb (68 kg)    Current Outpatient Medications  Medication Sig Dispense Refill   atorvastatin (LIPITOR) 10 MG tablet Take 10 mg by mouth daily.     gabapentin (NEURONTIN) 300 MG capsule Take 300 mg by mouth 2 (two) times daily.     pantoprazole  (PROTONIX ) 40 MG tablet Take 1 tablet (40 mg total) by mouth 2 (two) times daily. 60 tablet 11   traMADol (ULTRAM) 50 MG tablet Take 1 tablet by mouth daily.  0   meloxicam (MOBIC)  15 MG tablet Take 15 mg by mouth daily. with food (Patient not taking: Reported on 04/05/2024)  6   No current facility-administered medications for this visit.    Past Medical History:  Diagnosis Date   Arthritis    Chronic neck pain    Dyslipidemia     Past Surgical History:  Procedure Laterality Date   CERVICAL SPINE SURGERY N/A    COLONOSCOPY N/A 02/09/2024   Procedure: COLONOSCOPY;  Surgeon: Cindie Carlin POUR, DO;  Location: [facility];  Service: Endoscopy;  Laterality: N/A;   1245PM, ASA 2   ESOPHAGEAL DILATION N/A 02/09/2024   Procedure: DILATION, ESOPHAGUS;  Surgeon: Cindie Carlin POUR, DO;  Location: [facility];  Service: Endoscopy;  Laterality: N/A;   ESOPHAGOGASTRODUODENOSCOPY N/A 02/09/2024   Procedure: EGD (ESOPHAGOGASTRODUODENOSCOPY);  Surgeon: Cindie Carlin POUR, DO;  Location: [facility];  Service: Endoscopy;  Laterality: N/A;    Family History  Problem Relation Age of Onset   Breast cancer Neg Hx    Celiac disease Neg Hx    Cirrhosis Neg Hx    Clotting disorder Neg Hx    Colitis Neg Hx    Colon polyps Neg Hx    Crohn's disease Neg Hx    Cystic fibrosis Neg Hx    Diabetes Neg Hx    Esophageal cancer Neg Hx    Heart disease Neg Hx    Hemochromatosis Neg Hx    Inflammatory bowel disease Neg Hx    Irritable bowel syndrome Neg Hx    Kidney disease Neg Hx    Liver cancer Neg Hx    Liver disease Neg Hx    Ovarian cancer Neg Hx    Pancreatic cancer Neg Hx    Prostate cancer Neg Hx    Rectal cancer Neg Hx    Stomach cancer Neg Hx    Ulcerative colitis Neg Hx    Uterine cancer Neg Hx    Wilson's disease Neg Hx    Colon cancer Neg Hx     Allergies as of 04/05/2024   (No Known Allergies)    Social History   Socioeconomic History   Marital status: Married    Spouse name: Not on file   Number of children: Not on file   Years of education: Not on file   Highest education level: Not on file  Occupational History   Not on file  Tobacco Use   Smoking status: Every Day    Current packs/day: 1.00    Average packs/day: 1 pack/day for 16.2 years (16.2 ttl pk-yrs)    Types: Cigarettes    Start date: 01/10/2008   Smokeless tobacco: Never  Vaping Use   Vaping status: Never Used  Substance and Sexual Activity   Alcohol use: Never   Drug use: Never   Sexual activity: Not on file  Other Topics Concern   Not on file  Social History Narrative   Not on file   Social Drivers of Health   Financial Resource Strain: Not on file  Food  Insecurity: Not on file  Transportation Needs: Not on file  Physical Activity: Not on file  Stress: Not on file  Social Connections: Not on file     Review of Systems   Gen: Denies fever, chills, anorexia. Denies fatigue, weakness, weight loss.  CV: Denies chest pain, palpitations, syncope, peripheral edema, and claudication. Resp: Denies dyspnea at rest, cough, wheezing, coughing up blood, and pleurisy. GI: See HPI Derm: Denies rash, itching, dry skin Psych:  Denies depression, anxiety, memory loss, confusion. No homicidal or suicidal ideation.  Heme: Denies bruising, bleeding, and enlarged lymph nodes.  Physical Exam   BP 118/71 (BP Location: Right Arm, Patient Position: Sitting, Cuff Size: Normal)   Pulse 69   Temp 97.7 F (36.5 C) (Temporal)   Ht 5' 6 (1.676 m)   Wt 133 lb 6.4 oz (60.5 kg)   BMI 21.53 kg/m   General:   Alert and oriented. No distress noted. Pleasant and cooperative.  Head:  Normocephalic and atraumatic. Eyes:  Conjuctiva clear without scleral icterus. Mouth:  Oral mucosa pink and moist. Poor dentition. . Abdomen:   non-distended Rectal: deferred Msk:  Symmetrical without gross deformities. Normal posture. Extremities:  Without edema. Neurologic:  Alert and  oriented x4 Psych:  Alert and cooperative. Normal mood and affect.  Assessment  Billy Richards is a 63 y.o. male presenting today for follow up post procedures.     GERD and Barrett's esophagus Barrett's esophagus with intestinal metaplasia, increasing cancer risk. Currently well-managed with pantoprazole , no upper abdominal pain. - Continue pantoprazole  40 mg twice daily. - Consider reducing pantoprazole  to once daily in six months if no worsening symptoms. - Recommend vitamin D supplementation to mitigate osteoporosis risk. - Disussed potential long-term effects of PPI.   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 No active symptoms,  reduced ulcer risk with acid suppression therapy. - Continue avoiding NSAIDs. - Continue pantoprazole  to reduce gastric acid and promote healing. Continue BID for several more months and then reduce to daily.   Esophageal Schatzki's ring, status post dilation with persistent dysphagia Persistent mild dysphagia post-dilation, occurring with large boluses. No pain, obstruction, coughing, or aspiration. - Monitor swallowing symptoms and reassess in six months. - Encourage thorough chewing of food to prevent large bolus swallowing. - May consider BPE or manometry if worsening symptoms.   Colonic polyps, status post removal Colonic polyps removed, precancerous but benign. Follow-up colonoscopy recommended due to polyp count. - Schedule follow-up colonoscopy in three years.      PLAN   Follow up 6 months.   Charmaine Melia, MSN, FNP-BC, AGACNP-BC [facility]

## 2024-04-05 ENCOUNTER — Ambulatory Visit: Admitting: Gastroenterology

## 2024-04-05 ENCOUNTER — Encounter: Payer: Self-pay | Admitting: Gastroenterology

## 2024-04-05 VITALS — BP 118/71 | HR 69 | Temp 97.7°F | Ht 66.0 in | Wt 133.4 lb

## 2024-04-05 DIAGNOSIS — R1310 Dysphagia, unspecified: Secondary | ICD-10-CM

## 2024-04-05 DIAGNOSIS — R1013 Epigastric pain: Secondary | ICD-10-CM

## 2024-04-05 DIAGNOSIS — K2960 Other gastritis without bleeding: Secondary | ICD-10-CM

## 2024-04-05 DIAGNOSIS — K219 Gastro-esophageal reflux disease without esophagitis: Secondary | ICD-10-CM

## 2024-04-05 DIAGNOSIS — Z791 Long term (current) use of non-steroidal anti-inflammatories (NSAID): Secondary | ICD-10-CM | POA: Diagnosis not present

## 2024-04-05 DIAGNOSIS — Z860100 Personal history of colon polyps, unspecified: Secondary | ICD-10-CM

## 2024-04-05 DIAGNOSIS — K2270 Barrett's esophagus without dysplasia: Secondary | ICD-10-CM | POA: Diagnosis not present

## 2024-04-05 NOTE — Patient Instructions (Signed)
 VISIT SUMMARY:  You came in today for a follow-up on your swallowing difficulties and other gastrointestinal issues. We discussed your Barrett's esophagus, chronic gastritis, and the status of your esophageal Schatzki's ring. We also reviewed your recent colonoscopy results and your current medications.  YOUR PLAN:  -BARRETT'S ESOPHAGUS: Barrett's esophagus is a condition where the lining of the esophagus changes, increasing the risk of cancer. You should continue taking pantoprazole  40 mg twice daily. If your inflammation improves, we may reduce this to once daily in six months. Additionally, start taking vitamin D supplement (1000 units daily) to reduce the risk of osteoporosis secondary to long term PPI use.   -CHRONIC GASTRITIS DUE TO NSAID USE: Chronic gastritis is inflammation of the stomach lining, often caused by NSAIDs. Since you have stopped using NSAIDs and are taking pantoprazole , your risk of ulcers is reduced. Continue avoiding NSAIDs and keep taking pantoprazole  to help your stomach heal.  -ESOPHAGEAL SCHATZKI'S RING, STATUS POST DILATION WITH PERSISTENT DYSPHAGIA: A Schatzki's ring is a narrowing of the esophagus that can cause swallowing difficulties. Although your swallowing issues are mild and occur only with large bites, continue to monitor your symptoms and chew your food thoroughly. We will reassess in six months. Can consider further imaging or motility testing if symptoms worsen.   -COLONIC POLYPS, STATUS POST REMOVAL: Colonic polyps are growths in the colon that can become cancerous. Your polyps were removed and were benign, but you should have a follow-up colonoscopy in three years to ensure no new polyps have developed.  INSTRUCTIONS:  Please continue taking your medications as prescribed and start taking vitamin D supplements. Monitor your swallowing symptoms and chew your food thoroughly. Schedule a follow-up colonoscopy in three years. We will reassess your swallowing  difficulties in six months.  Contains text generated by [employer].   It was a pleasure to see you today. I want to create trusting relationships with patients. If you receive a survey regarding your visit,  I greatly appreciate you taking time to fill this out on paper or through your MyChart. I value your feedback.  Charmaine Melia, MSN, FNP-BC, AGACNP-BC [facility]

## 2024-04-22 DIAGNOSIS — E782 Mixed hyperlipidemia: Secondary | ICD-10-CM | POA: Diagnosis not present

## 2024-04-22 DIAGNOSIS — R7303 Prediabetes: Secondary | ICD-10-CM | POA: Diagnosis not present

## 2024-04-28 DIAGNOSIS — M542 Cervicalgia: Secondary | ICD-10-CM | POA: Diagnosis not present

## 2024-04-28 DIAGNOSIS — M5431 Sciatica, right side: Secondary | ICD-10-CM | POA: Diagnosis not present

## 2024-04-28 DIAGNOSIS — E782 Mixed hyperlipidemia: Secondary | ICD-10-CM | POA: Diagnosis not present

## 2024-04-28 DIAGNOSIS — R1310 Dysphagia, unspecified: Secondary | ICD-10-CM | POA: Diagnosis not present

## 2024-04-28 DIAGNOSIS — R634 Abnormal weight loss: Secondary | ICD-10-CM | POA: Diagnosis not present

## 2024-04-28 DIAGNOSIS — E875 Hyperkalemia: Secondary | ICD-10-CM | POA: Diagnosis not present

## 2024-04-28 DIAGNOSIS — F17210 Nicotine dependence, cigarettes, uncomplicated: Secondary | ICD-10-CM | POA: Diagnosis not present

## 2024-04-28 DIAGNOSIS — K2100 Gastro-esophageal reflux disease with esophagitis, without bleeding: Secondary | ICD-10-CM | POA: Diagnosis not present

## 2024-04-28 DIAGNOSIS — R7303 Prediabetes: Secondary | ICD-10-CM | POA: Diagnosis not present

## 2024-07-06 DIAGNOSIS — M461 Sacroiliitis, not elsewhere classified: Secondary | ICD-10-CM | POA: Diagnosis not present

## 2024-07-06 DIAGNOSIS — M5416 Radiculopathy, lumbar region: Secondary | ICD-10-CM | POA: Diagnosis not present

## 2024-07-13 DIAGNOSIS — M47816 Spondylosis without myelopathy or radiculopathy, lumbar region: Secondary | ICD-10-CM | POA: Diagnosis not present

## 2024-07-13 DIAGNOSIS — M48061 Spinal stenosis, lumbar region without neurogenic claudication: Secondary | ICD-10-CM | POA: Diagnosis not present

## 2024-07-13 DIAGNOSIS — M4185 Other forms of scoliosis, thoracolumbar region: Secondary | ICD-10-CM | POA: Diagnosis not present

## 2024-07-13 DIAGNOSIS — M5136 Other intervertebral disc degeneration, lumbar region with discogenic back pain only: Secondary | ICD-10-CM | POA: Diagnosis not present

## 2024-07-13 DIAGNOSIS — M5416 Radiculopathy, lumbar region: Secondary | ICD-10-CM | POA: Diagnosis not present
